# Patient Record
Sex: Male | Born: 1965 | Race: White | Hispanic: No | Marital: Married | Smoking: Never smoker
Health system: Southern US, Community
[De-identification: ages and names within clinical notes are randomized; demographics above are authoritative.]

## PROBLEM LIST (undated history)

## (undated) DIAGNOSIS — N289 Disorder of kidney and ureter, unspecified: Secondary | ICD-10-CM

## (undated) DIAGNOSIS — Z91018 Allergy to other foods: Secondary | ICD-10-CM

---

## 2006-02-28 ENCOUNTER — Emergency Department: Payer: Self-pay | Admitting: Emergency Medicine

## 2006-03-02 ENCOUNTER — Ambulatory Visit: Payer: Self-pay | Admitting: Urology

## 2006-03-05 ENCOUNTER — Ambulatory Visit: Payer: Self-pay | Admitting: Urology

## 2006-03-11 ENCOUNTER — Ambulatory Visit: Payer: Self-pay | Admitting: Urology

## 2006-03-26 ENCOUNTER — Ambulatory Visit: Payer: Self-pay | Admitting: Urology

## 2006-04-27 ENCOUNTER — Ambulatory Visit: Payer: Self-pay | Admitting: Urology

## 2007-01-25 ENCOUNTER — Ambulatory Visit: Payer: Self-pay | Admitting: Urology

## 2007-02-10 IMAGING — CR DG ABDOMEN 1V
1 series · 2 of 2 positions shown · non-contrast
Comparison: none

REASON FOR EXAM: NEPHROLITHIASIS
COMMENTS:

[Series 1: view not recorded · 0.17mm/px · 2 of 2 slices shown]
[im 1/2]
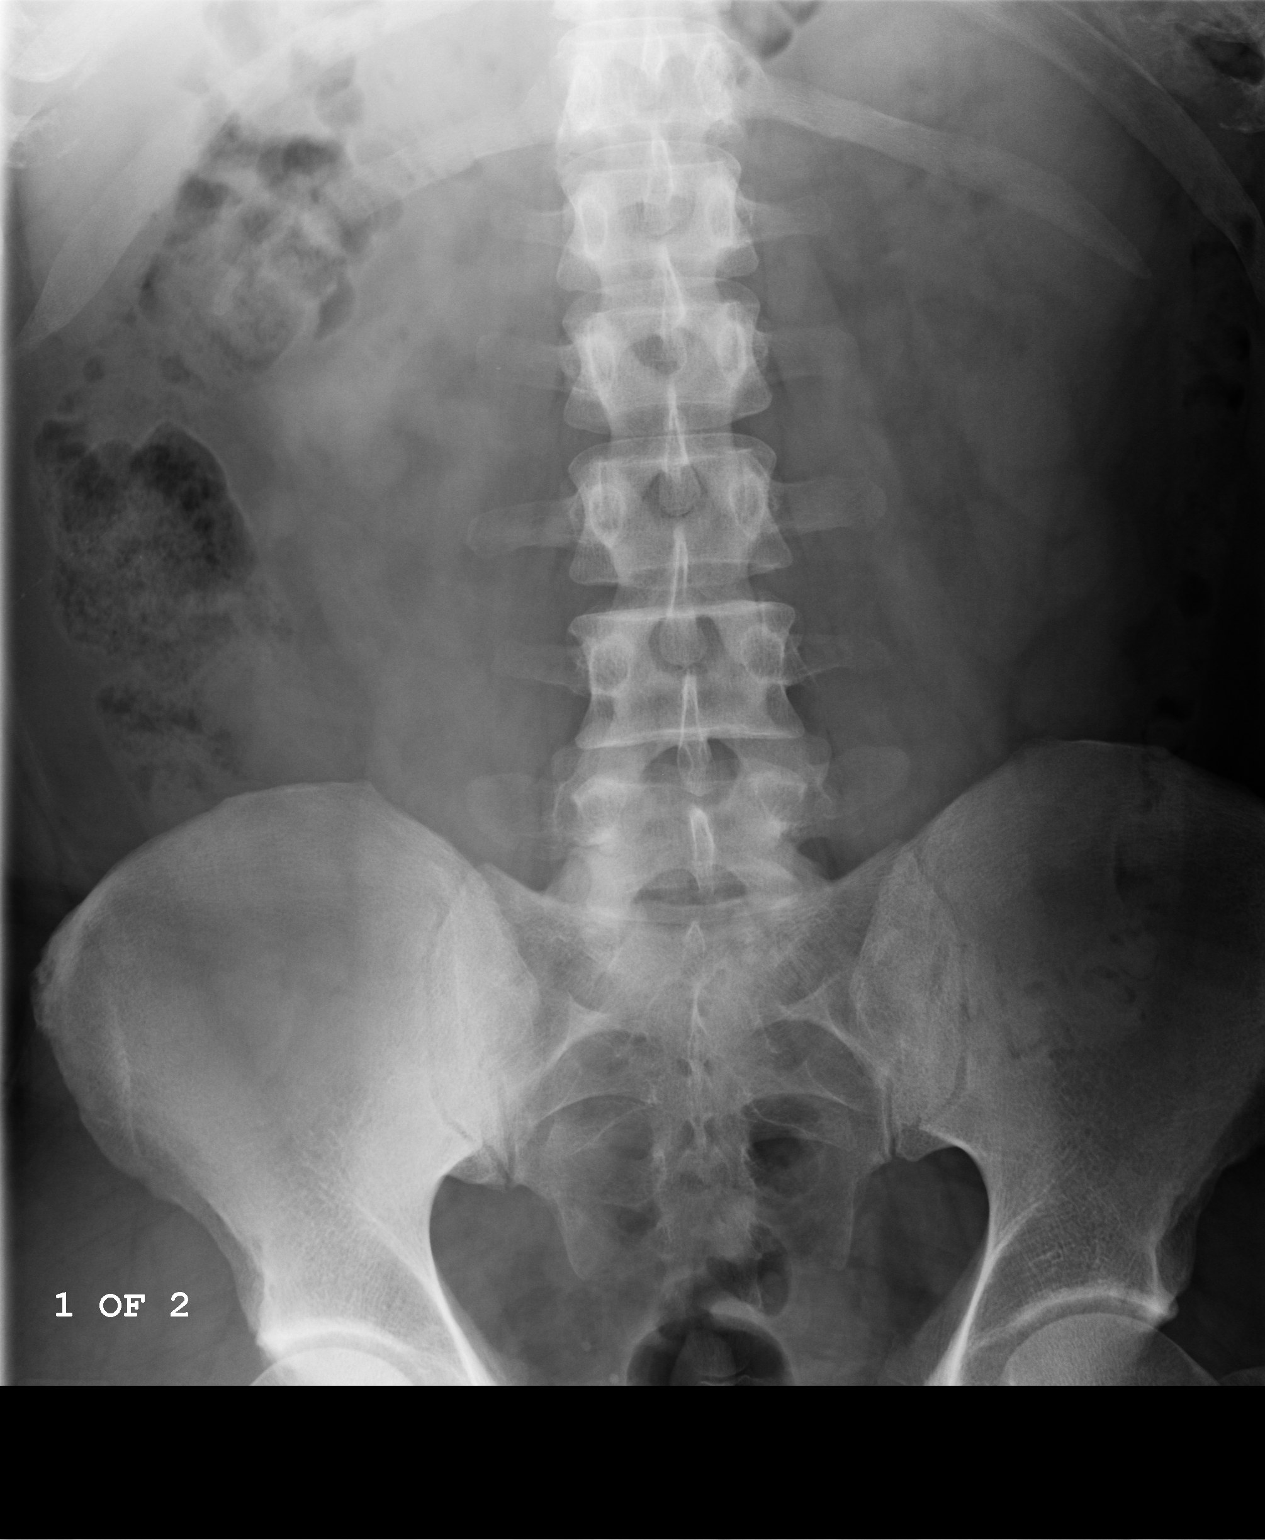
[im 2/2]
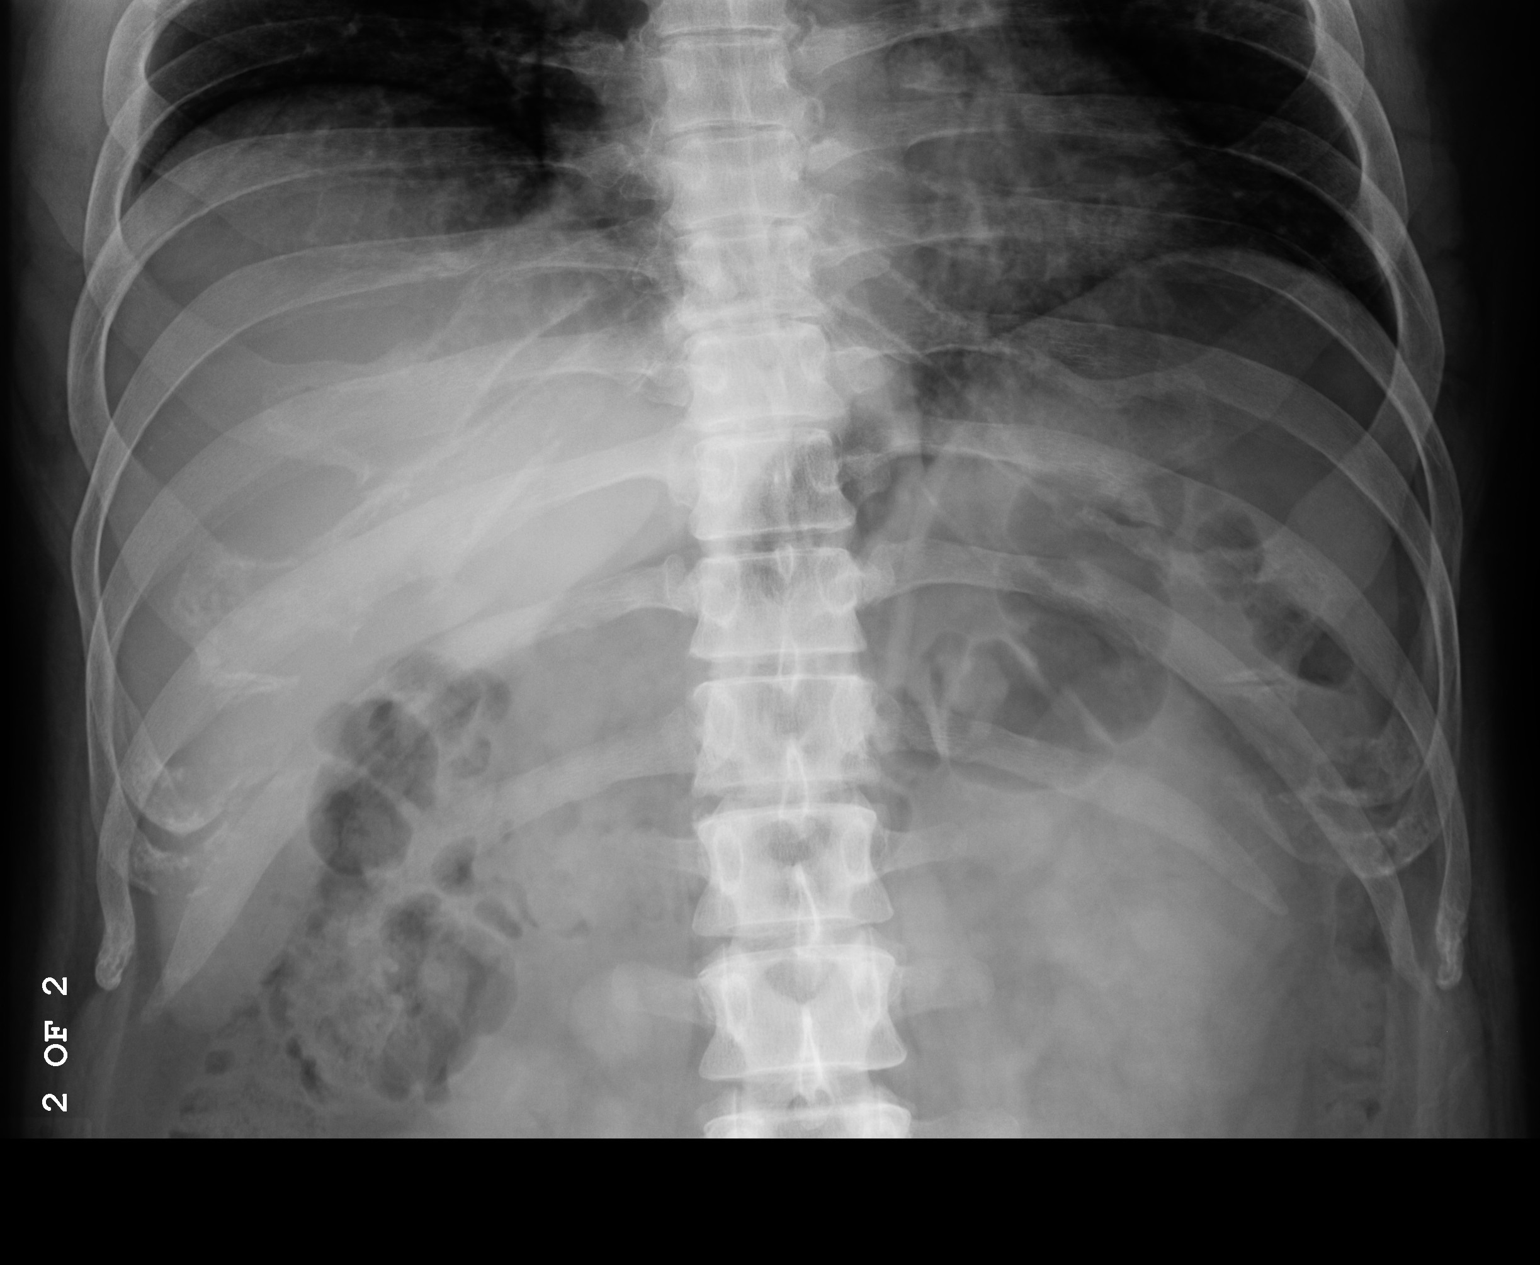

[2 of 2 positions shown; findings below may reference images not displayed]

PROCEDURE:     DXR - DXR KIDNEY URETER BLADDER  - March 26, 2006  [DATE]

RESULT:     AP view of the abdomen is compared to the prior exam of
03/11/2006.

The previously noted calcific density superior to the L4 transverse process
on the RIGHT is no longer seen. The tiny stone at the lower pole of the LEFT
kidney, reported on previous CT, is not definitely seen on plain film
examination. A few phleboliths are noted in the pelvis.
IMPRESSION: Please see above.

## 2008-10-09 ENCOUNTER — Ambulatory Visit: Payer: Self-pay | Admitting: Urology

## 2008-10-16 ENCOUNTER — Ambulatory Visit: Payer: Self-pay | Admitting: Urology

## 2008-10-17 ENCOUNTER — Ambulatory Visit: Payer: Self-pay | Admitting: Urology

## 2008-10-19 ENCOUNTER — Ambulatory Visit: Payer: Self-pay | Admitting: Urology

## 2008-11-06 ENCOUNTER — Ambulatory Visit: Payer: Self-pay | Admitting: Urology

## 2008-12-08 ENCOUNTER — Ambulatory Visit: Payer: Self-pay | Admitting: Gastroenterology

## 2011-07-24 ENCOUNTER — Ambulatory Visit: Payer: Self-pay | Admitting: Urology

## 2011-07-31 ENCOUNTER — Ambulatory Visit: Payer: Self-pay | Admitting: Urology

## 2011-08-14 ENCOUNTER — Ambulatory Visit: Payer: Self-pay | Admitting: Urology

## 2012-06-30 IMAGING — CR DG ABDOMEN 1V
1 series · 2 of 2 positions shown · non-contrast
Comparison: none

REASON FOR EXAM: nephrolithiasis post litho
COMMENTS:

PROCEDURE:     DXR - DXR KIDNEY URETER BLADDER  - August 14, 2011  [DATE]
RESULT:     Comparison: [DATE]

[Series 3: t abdomen supine · 0.14mm/px · 2 of 2 slices shown]
[im 1/2]
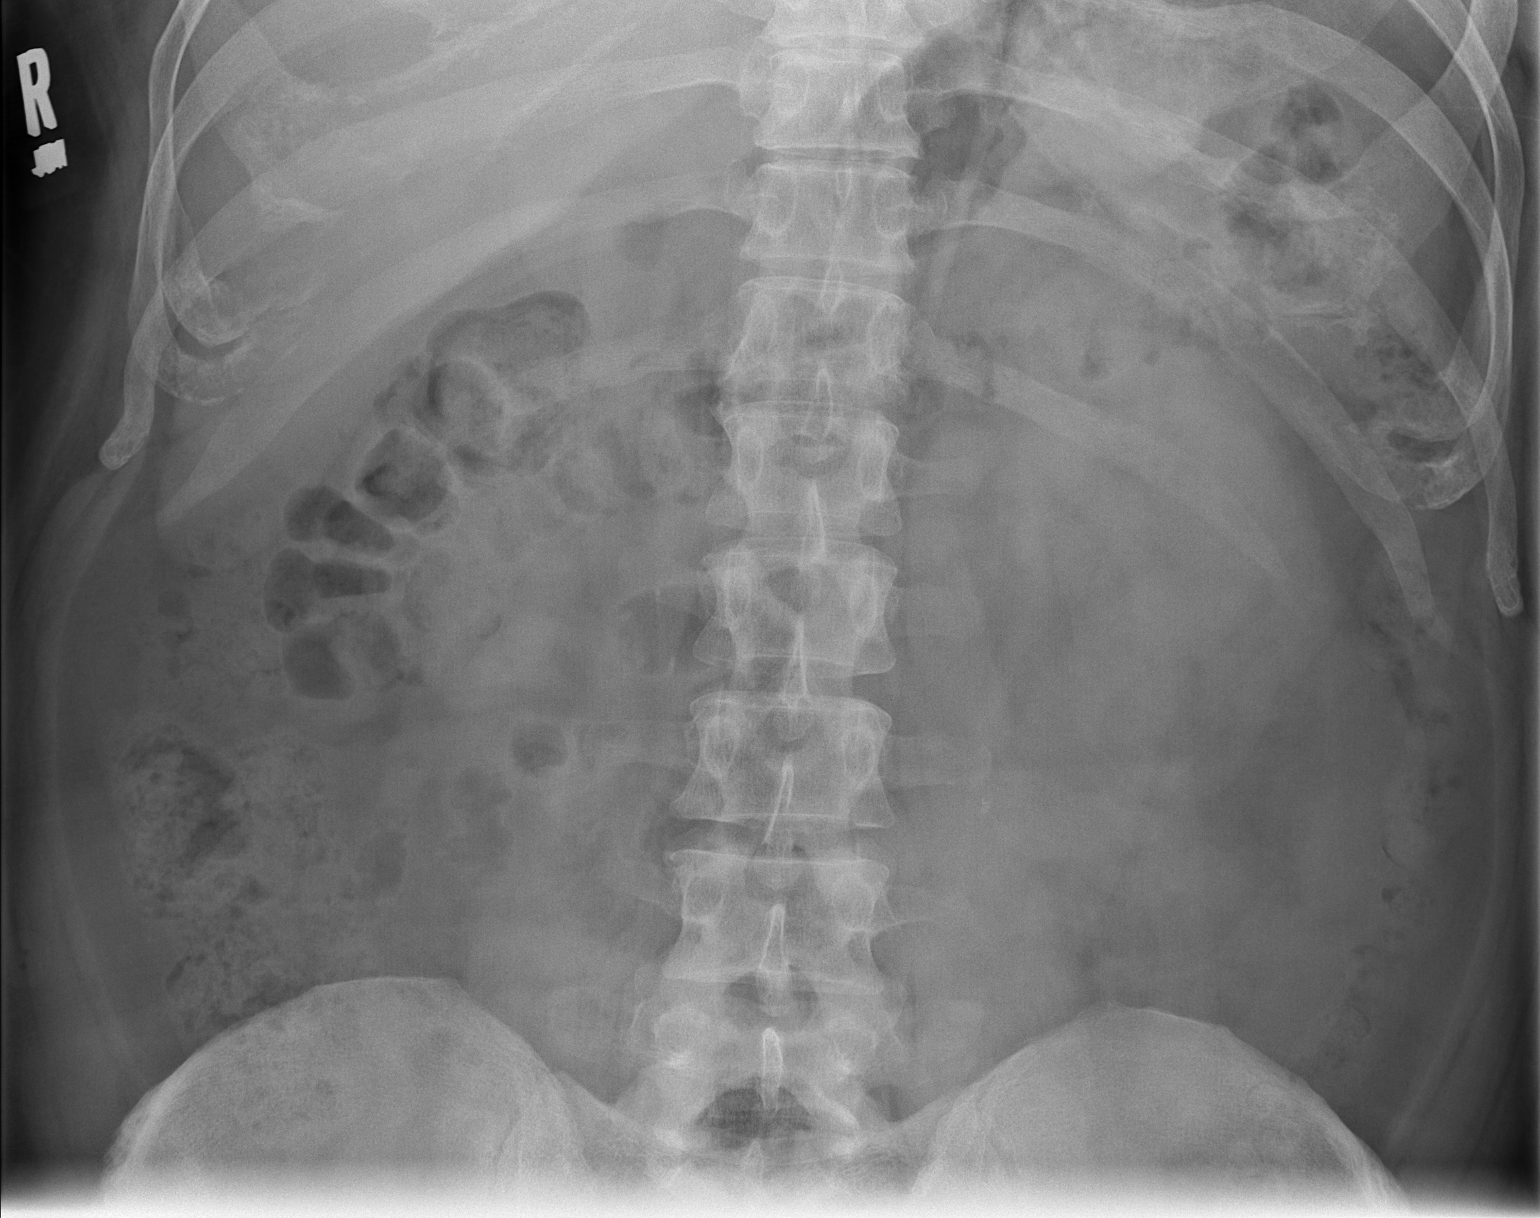
[im 2/2]
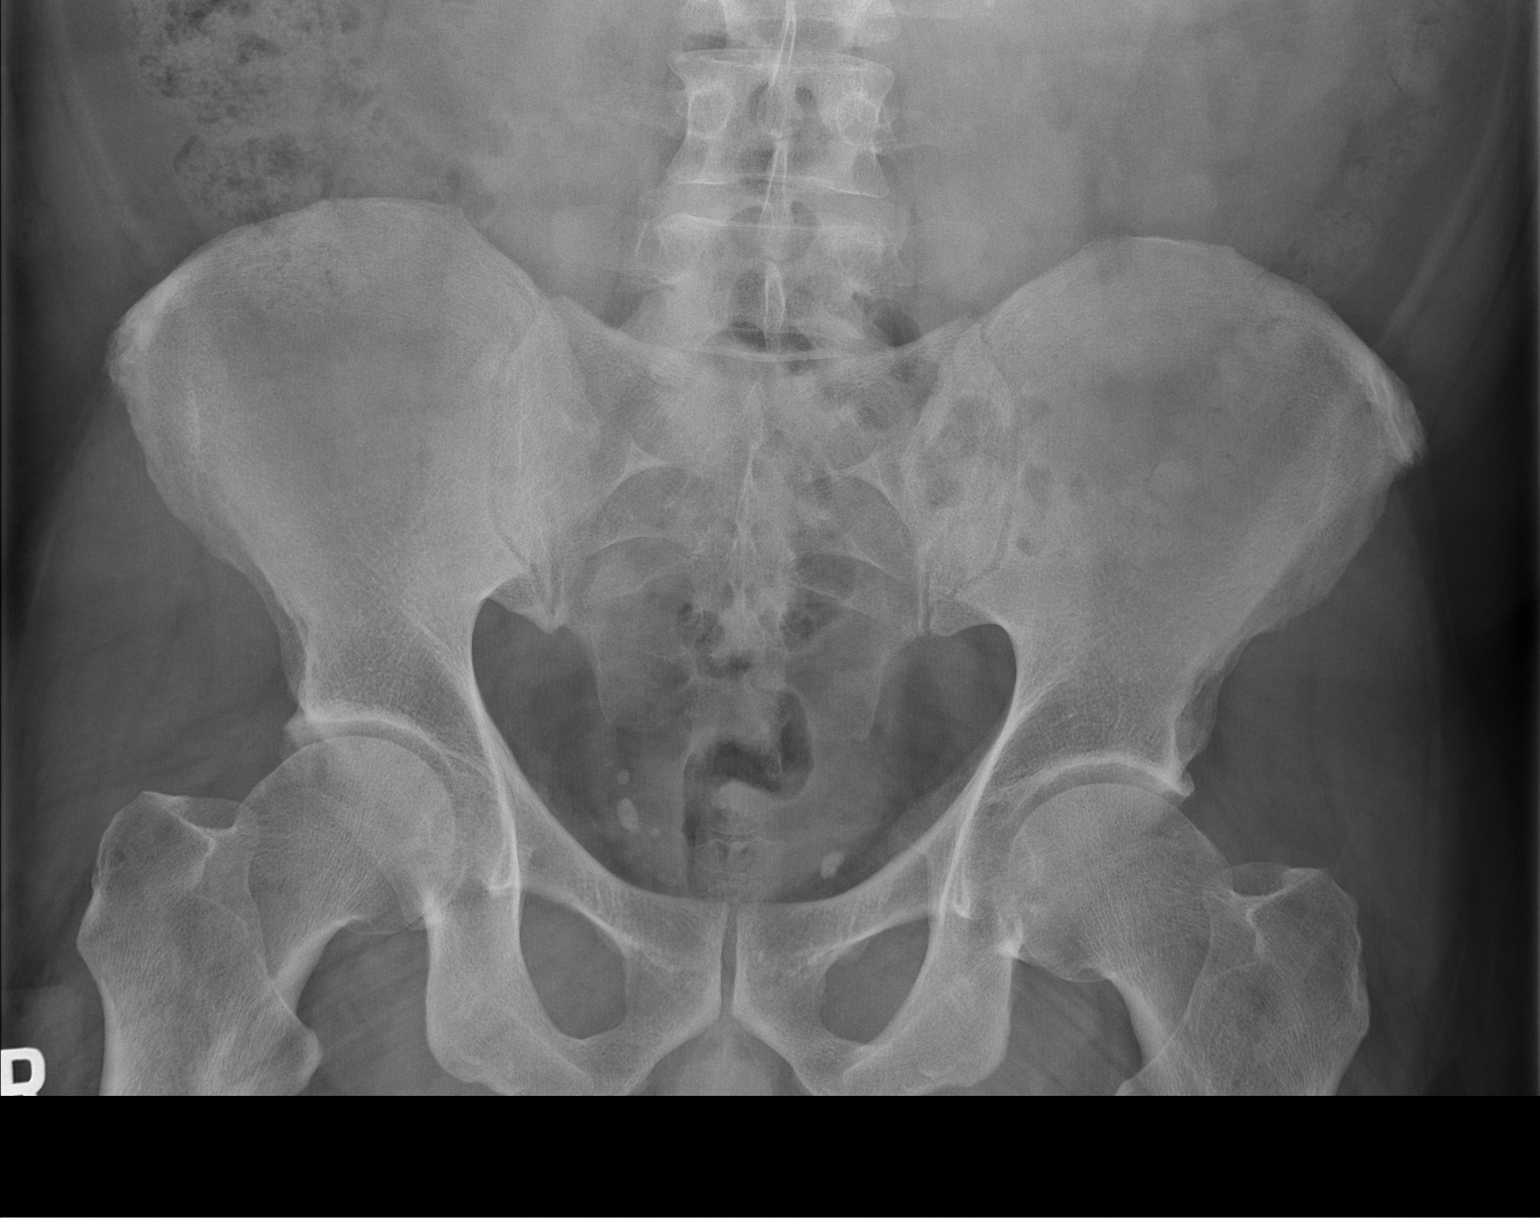

[2 of 2 positions shown; findings below may reference images not displayed]

FINDINGS: The calculus in the region of the mid left ureter is no longer appreciated.
There is a tiny, 3 mm radiodensity in this region, adjacent to the left
transverse process of L3, which may represent a residual calculus.
Calcifications in the pelvis likely represent phleboliths.
IMPRESSION: Findings which may represent a tiny residual calculus in the left mid ureter.

## 2015-12-06 DIAGNOSIS — J111 Influenza due to unidentified influenza virus with other respiratory manifestations: Secondary | ICD-10-CM | POA: Diagnosis not present

## 2018-03-05 ENCOUNTER — Emergency Department
Admission: EM | Admit: 2018-03-05 | Discharge: 2018-03-05 | Disposition: A | Discharge status: Home / Self Care | Payer: 59 | Attending: Emergency Medicine | Admitting: Emergency Medicine

## 2018-03-05 ENCOUNTER — Encounter: Payer: Self-pay | Admitting: Emergency Medicine

## 2018-03-05 DIAGNOSIS — Y92009 Unspecified place in unspecified non-institutional (private) residence as the place of occurrence of the external cause: Secondary | ICD-10-CM | POA: Diagnosis not present

## 2018-03-05 DIAGNOSIS — Y9389 Activity, other specified: Secondary | ICD-10-CM | POA: Diagnosis not present

## 2018-03-05 DIAGNOSIS — S31815A Open bite of right buttock, initial encounter: Secondary | ICD-10-CM | POA: Insufficient documentation

## 2018-03-05 DIAGNOSIS — Y990 Civilian activity done for income or pay: Secondary | ICD-10-CM | POA: Insufficient documentation

## 2018-03-05 DIAGNOSIS — S79921A Unspecified injury of right thigh, initial encounter: Secondary | ICD-10-CM | POA: Diagnosis present

## 2018-03-05 DIAGNOSIS — S71151A Open bite, right thigh, initial encounter: Secondary | ICD-10-CM | POA: Diagnosis not present

## 2018-03-05 DIAGNOSIS — S31813A Puncture wound without foreign body of right buttock, initial encounter: Secondary | ICD-10-CM | POA: Diagnosis not present

## 2018-03-05 DIAGNOSIS — S71131A Puncture wound without foreign body, right thigh, initial encounter: Secondary | ICD-10-CM | POA: Diagnosis not present

## 2018-03-05 DIAGNOSIS — W540XXA Bitten by dog, initial encounter: Secondary | ICD-10-CM | POA: Insufficient documentation

## 2018-03-05 HISTORY — DX: Disorder of kidney and ureter, unspecified: N28.9

## 2018-03-05 HISTORY — DX: Allergy to other foods: Z91.018

## 2018-03-05 MED ORDER — TETANUS-DIPHTH-ACELL PERTUSSIS 5-2.5-18.5 LF-MCG/0.5 IM SUSP
0.5000 mL | Freq: Once | INTRAMUSCULAR | Status: DC
  Filled 2018-03-05: qty 0.5

## 2018-03-05 MED ORDER — AMOXICILLIN-POT CLAVULANATE 875-125 MG PO TABS: 1.0000 | ORAL_TABLET | Freq: Two times a day (BID) | ORAL | 0 refills | Status: DC

## 2018-03-05 NOTE — ED Triage Notes (Signed)
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  Patient states he does not know if dog had rabies shots and has not yet contacted animal control.  Patient states he is self-employed with the [employer].  Patient states this will not be workman's compensation claim.  Patient reports bites to his right thigh and left buttock.  Patient states, "there are punctures."

## 2018-03-05 NOTE — Discharge Instructions (Addendum)
Follow-up with the emergency department if the dog begins to show any signs of rabies.  Have animal control checked the rabies status of the animal.  Wash the dog bite areas with soap and water.  Take the Augmentin as prescribed.

## 2018-03-05 NOTE — ED Notes (Signed)
Pt has a protein allergy. Pharmacy reports that the Tdap  Vaccine is grown in beef cultures. Pt refusing vaccine due to allergic reaction.

## 2018-03-05 NOTE — ED Notes (Signed)
Animal control in room with pt.

## 2018-03-05 NOTE — ED Provider Notes (Signed)
[facility] Emergency Department Provider Note  ____________________________________________   First MD Initiated Contact with Patient 03/05/18 1031     (approximate)  I have reviewed the triage vital signs and the nursing notes.   HISTORY  Chief Complaint Animal Bite    HPI Kevin Alvarez is a 52 y.o. male presents emergency department complaining of being bitten by a dog.  He states the dog was a very large dog.  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  He states he is here mainly to have this documented.  He states there are puncture wounds and teeth marks on his leg.  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  He is concerned if the tetanus has any of this in it.  Past Medical History:  Diagnosis Date  . Allergy to alpha-gal   . Renal disorder    Kidney stones    There are no active problems to display for this patient.   History reviewed. No pertinent surgical history.  Prior to Admission medications   Medication Sig Start Date End Date Taking? Authorizing Provider  amoxicillin-clavulanate (AUGMENTIN) 875-125 MG tablet Take 1 tablet by mouth 2 (two) times daily. 03/05/18   Faythe Ghee, PA-C    Allergies Pork-derived products  No family history on file.  Social History Social History   Tobacco Use  . Smoking status: Never Smoker  . Smokeless tobacco: Never Used  Substance Use Topics  . Alcohol use: Yes    Comment: occasionally  . Drug use: Not on file    Review of Systems  Constitutional: No fever/chills Eyes: No visual changes. ENT: No sore throat. Respiratory: Denies cough Genitourinary: Negative for dysuria. Musculoskeletal: Negative for back pain. Skin: Negative for rash.  Stiff for dog bite to the right thigh    ____________________________________________   PHYSICAL EXAM:  VITAL SIGNS: ED Triage Vitals [03/05/18  1015]  Enc Vitals Group     BP (!) 146/105     Pulse Rate 81     Resp 18     Temp 99.3 F (37.4 C)     Temp Source Oral     SpO2 94 %     Weight 245 lb (111.1 kg)     Height 5\' 10"  (1.778 m)     Head Circumference      Peak Flow      Pain Score 1     Pain Loc      Pain Edu?      Excl. in GC?     Constitutional: Alert and oriented. Well appearing and in no acute distress. Eyes: Conjunctivae are normal.  Head: Atraumatic. Nose: No congestion/rhinnorhea. Mouth/Throat: Mucous membranes are moist.   Cardiovascular: Normal rate, regular rhythm. Respiratory: Normal respiratory effort.  No retractions GU: deferred Musculoskeletal: FROM all extremities, warm and well perfused Neurologic:  Normal speech and language.  Skin:  Skin is warm, dry .Marland Kitchen Positive for teeth marks and 2 puncture wounds on the right thigh, 2 puncture wounds on the right buttock, no foreign body is noted.  The areas are not bleeding or weeping at this time.   Psychiatric: Mood and affect are normal. Speech and behavior are normal.  ____________________________________________   LABS (all labs ordered are listed, but only abnormal results are displayed)  Labs Reviewed - No data to display ____________________________________________  ____________________________________________  RADIOLOGY    ____________________________________________   PROCEDURES  Procedure(s) performed: No  Procedures    ____________________________________________   INITIAL IMPRESSION / ASSESSMENT AND PLAN / ED COURSE  Pertinent labs & imaging results that were available during my care of the patient were reviewed by me and considered in my medical decision making (see chart for details).  Patient is 52 year old male presents emergency department complaining of a dog bite.  The dog was inside the house he was inspecting for an appraisal.  The owner stated that the immunizations are up-to-date but he is unsure.  Animal  control was notified while he was here in the emergency department.  On physical exam the patient has teeth marks and 2 puncture wounds on the right thigh, 2 puncture wounds on the right buttock.  Not weeping or actively bleeding at this time.  Due to the patient being allergic to be, we did not give tetanus as this is in a beef derived base.  Patient's wounds were cleaned with saline.  He is to follow-up with his regular doctor if there is any sign of infection.  He was given a prescription for Augmentin 875 twice daily.  He is to follow-up with his regular doctor as needed.  If the dog shows any signs of rabies, the patient is to return to the emergency department for rabies vaccinations.  Animal control was notified so that this can be documented.  The patient states he understands all of her instructions.  He was discharged in stable condition.     As part of my medical decision making, I reviewed the following data within the electronic MEDICAL RECORD NUMBER Nursing notes reviewed and incorporated, Old chart reviewed, Notes from prior ED visits and [place] Controlled Substance Database  ____________________________________________   FINAL CLINICAL IMPRESSION(S) / ED DIAGNOSES  Final diagnoses:  Dog bite, initial encounter      NEW MEDICATIONS STARTED DURING THIS VISIT:  Discharge Medication List as of 03/05/2018 11:12 AM    START taking these medications   Details  amoxicillin-clavulanate (AUGMENTIN) 875-125 MG tablet Take 1 tablet by mouth 2 (two) times daily., Starting Fri 03/05/2018, Print         Note:  This document was prepared using Dragon voice recognition software and may include unintentional dictation errors.    Faythe GheeFisher, Carrera Kiesel W, PA-C 03/05/18 1805    Don PerkingVeronese, [place], MD 03/14/18 [phone]

## 2018-03-05 NOTE — ED Notes (Signed)
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

## 2018-10-25 DIAGNOSIS — J069 Acute upper respiratory infection, unspecified: Secondary | ICD-10-CM | POA: Diagnosis not present

## 2020-04-18 ENCOUNTER — Ambulatory Visit (INDEPENDENT_AMBULATORY_CARE_PROVIDER_SITE_OTHER): Payer: 59 | Admitting: Urology

## 2020-04-18 ENCOUNTER — Other Ambulatory Visit: Payer: Self-pay

## 2020-04-18 ENCOUNTER — Ambulatory Visit
Admission: RE | Admit: 2020-04-18 | Discharge: 2020-04-18 | Disposition: A | Discharge status: Home / Self Care | Payer: 59 | Attending: Urology | Admitting: Urology

## 2020-04-18 ENCOUNTER — Encounter: Payer: Self-pay | Admitting: Urology

## 2020-04-18 ENCOUNTER — Ambulatory Visit
Admission: RE | Admit: 2020-04-18 | Discharge: 2020-04-18 | Disposition: A | Discharge status: Home / Self Care | Payer: 59 | Source: Ambulatory Visit | Attending: Urology | Admitting: Urology

## 2020-04-18 VITALS — BP 166/94 | HR 73 | Ht 70.0 in | Wt 239.0 lb

## 2020-04-18 DIAGNOSIS — M160 Bilateral primary osteoarthritis of hip: Secondary | ICD-10-CM | POA: Diagnosis not present

## 2020-04-18 DIAGNOSIS — N200 Calculus of kidney: Secondary | ICD-10-CM | POA: Diagnosis not present

## 2020-04-18 DIAGNOSIS — R1030 Lower abdominal pain, unspecified: Secondary | ICD-10-CM

## 2020-04-18 DIAGNOSIS — R109 Unspecified abdominal pain: Secondary | ICD-10-CM

## 2020-04-18 DIAGNOSIS — I878 Other specified disorders of veins: Secondary | ICD-10-CM | POA: Diagnosis not present

## 2020-04-18 NOTE — Progress Notes (Signed)
04/18/2020 2:22 PM   Kevin Alvarez 16-Jul-1966 [id number]  Referring provider: No referring provider defined for this encounter.  Chief Complaint  Patient presents with   Nephrolithiasis    HPI:  Kevin Alvarez made an appt today for umbilical, lower abd and low back pain. He had a "low grade 99.5 temp" and couldn't lay on his right side. He has a remote h/o stones and needed ESWL and this seemed similar. He is voiding normally. No dysuria or gross hematuria. He does not have a PCP. His mom passed away last week and he's been running around and busy. No constipation or diarrhea. He is staying hydrated. No N/V.   He had a KUB today. Unofficially, I don't see any stone. Bowel gas pattern normal. UA today is normal. No rbc.    PMH: Past Medical History:  Diagnosis Date   Allergy to alpha-gal    Renal disorder    Kidney stones    Surgical History: No past surgical history on file.  Home Medications:  Allergies as of 04/18/2020      Reactions   Pork-derived Products Rash   States he has protein allergy from tick bite      Medication List       Accurate as of April 18, 2020  2:22 PM. If you have any questions, ask your nurse or doctor.        STOP taking these medications   amoxicillin-clavulanate 875-125 MG tablet Commonly known as: AUGMENTIN Stopped by: Jerilee Field, MD       Allergies:  Allergies  Allergen Reactions   Pork-Derived Products Rash    States he has protein allergy from tick bite    Family History: No family history on file.  Social History:  reports that he has never smoked. He has never used smokeless tobacco. He reports current alcohol use. No history on file for drug use.   Physical Exam: BP (!) 166/94 (BP Location: Left Arm, Patient Position: Sitting, Cuff Size: Normal)    Pulse 73    Ht 5\' 10"  (1.778 m)    Wt 239 lb (108.4 kg)    BMI 34.29 kg/m   Constitutional:  Alert and oriented, No acute distress. HEENT: [place] AT, moist mucus  membranes.  Trachea midline, no masses. Cardiovascular: No clubbing, cyanosis, or edema. Respiratory: Normal respiratory effort, no increased work of breathing. GI: Abdomen is soft, nontender, nondistended, no abdominal masses GU: No CVA tenderness Lymph: No cervical or inguinal lymphadenopathy. Skin: No rashes, bruises or suspicious lesions. Neurologic: Grossly intact, no focal deficits, moving all 4 extremities. Psychiatric: Normal mood and affect. GU: Penis circumcised, normal foreskin, testicles descended bilaterally and palpably normal, bilateral epididymis palpably normal, scrotum normal DRE: Prostate 25 g, smooth without hard area or nodule  Laboratory Data: No results found for: WBC, HGB, HCT, MCV, PLT  No results found for: CREATININE  No results found for: PSA  No results found for: TESTOSTERONE  No results found for: HGBA1C  Urinalysis No results found for: COLORURINE, APPEARANCEUR, LABSPEC, PHURINE, GLUCOSEU, HGBUR, BILIRUBINUR, KETONESUR, PROTEINUR, UROBILINOGEN, NITRITE, LEUKOCYTESUR  No results found for: LABMICR, WBCUA, RBCUA, LABEPIT, MUCUS, BACTERIA  Pertinent Imaging: KUB  No results found for this or any previous visit.  No results found for this or any previous visit.  No results found for this or any previous visit.  No results found for this or any previous visit.  No results found for this or any previous visit.  No results found for this  or any previous visit.  No results found for this or any previous visit.  No results found for this or any previous visit.   Assessment & Plan:    1. abd pain - KUB, exam and UA negative. Discussed if he continues to have pain or it worsens, fever, etc to go to ED for CT. Otherwise let us know if any changes and I encouraged him to find a PCP.  - Urinalysis, Complete   No follow-ups on file.  Jerilee Field, MD  [facility] [address] [place], [place]  [address] [phone]

## 2020-04-18 NOTE — Patient Instructions (Signed)
Abdominal Pain, Adult Pain in the abdomen (abdominal pain) can be caused by many things. Often, abdominal pain is not serious and it gets better with no treatment or by being treated at home. However, sometimes abdominal pain is serious. Your health care provider will ask questions about your medical history and do a physical exam to try to determine the cause of your abdominal pain. Follow these instructions at home:  Medicines  Take over-the-counter and prescription medicines only as told by your health care provider.  Do not take a laxative unless told by your health care provider. General instructions  Watch your condition for any changes.  Drink enough fluid to keep your urine pale yellow.  Keep all follow-up visits as told by your health care provider. This is important. Contact a health care provider if:  Your abdominal pain changes or gets worse.  You are not hungry or you lose weight without trying.  You are constipated or have diarrhea for more than 2-3 days.  You have pain when you urinate or have a bowel movement.  Your abdominal pain wakes you up at night.  Your pain gets worse with meals, after eating, or with certain foods.  You are vomiting and cannot keep anything down.  You have a fever.  You have blood in your urine. Get help right away if:  Your pain does not go away as soon as your health care provider told you to expect.  You cannot stop vomiting.  Your pain is only in areas of the abdomen, such as the right side or the left lower portion of the abdomen. Pain on the right side could be caused by appendicitis.  You have bloody or black stools, or stools that look like tar.  You have severe pain, cramping, or bloating in your abdomen.  You have signs of dehydration, such as: ? Dark urine, very little urine, or no urine. ? Cracked lips. ? Dry mouth. ? Sunken eyes. ? Sleepiness. ? Weakness.  You have trouble breathing or chest  pain. Summary  Often, abdominal pain is not serious and it gets better with no treatment or by being treated at home. However, sometimes abdominal pain is serious.  Watch your condition for any changes.  Take over-the-counter and prescription medicines only as told by your health care provider.  Contact a health care provider if your abdominal pain changes or gets worse.  Get help right away if you have severe pain, cramping, or bloating in your abdomen. This information is not intended to replace advice given to you by your health care provider. Make sure you discuss any questions you have with your health care provider. Document Revised: 01/10/2019 Document Reviewed: 01/10/2019 Elsevier Patient Education  2020 [employer]vier Inc.  

## 2020-04-20 ENCOUNTER — Telehealth: Payer: Self-pay

## 2020-04-20 LAB — URINALYSIS, COMPLETE
Bilirubin, UA: NEGATIVE
Leukocytes,UA: NEGATIVE
Nitrite, UA: NEGATIVE
RBC, UA: NEGATIVE
Specific Gravity, UA: >1.03 — ABNORMAL HIGH (ref 1.005–1.030)
Urobilinogen, Ur: 2 mg/dL — ABNORMAL HIGH (ref 0.2–1.0)
pH, UA: 5.5 (ref 5.0–7.5)

## 2020-04-20 LAB — MICROSCOPIC EXAMINATION: Bacteria, UA: NONE SEEN

## 2020-04-20 NOTE — Telephone Encounter (Signed)
-----   Message from Jerilee Field, MD sent at 04/19/2020  5:10 PM EDT ----- Kevin Alvarez - let Kevin Alvarez know radiologist looked at his KUB and agree no obvious kidney stones. I hope he is feeling better.  ----- Message ----- From: Lissa Hoard, CMA Sent: 04/19/2020   9:14 AM EDT To: Jerilee Field, MD   ----- Message ----- From: Leory Plowman, Rad Results In Sent: 04/18/2020  10:01 PM EDT To: Lissa Hoard, CMA

## 2020-04-20 NOTE — Telephone Encounter (Signed)
Notified patient as advised. Patient verbalized understanding.  

## 2021-03-05 IMAGING — CR DG ABDOMEN 1V
1 series · 2 of 2 positions shown · non-contrast
Comparison: August 14, 2011

CLINICAL DATA: Flank pain with a history of stones.

EXAM:
ABDOMEN - 1 VIEW

[Series 1: dg abd 1 view · 0.14mm/px · 2 of 2 slices shown]
[im 1/2]
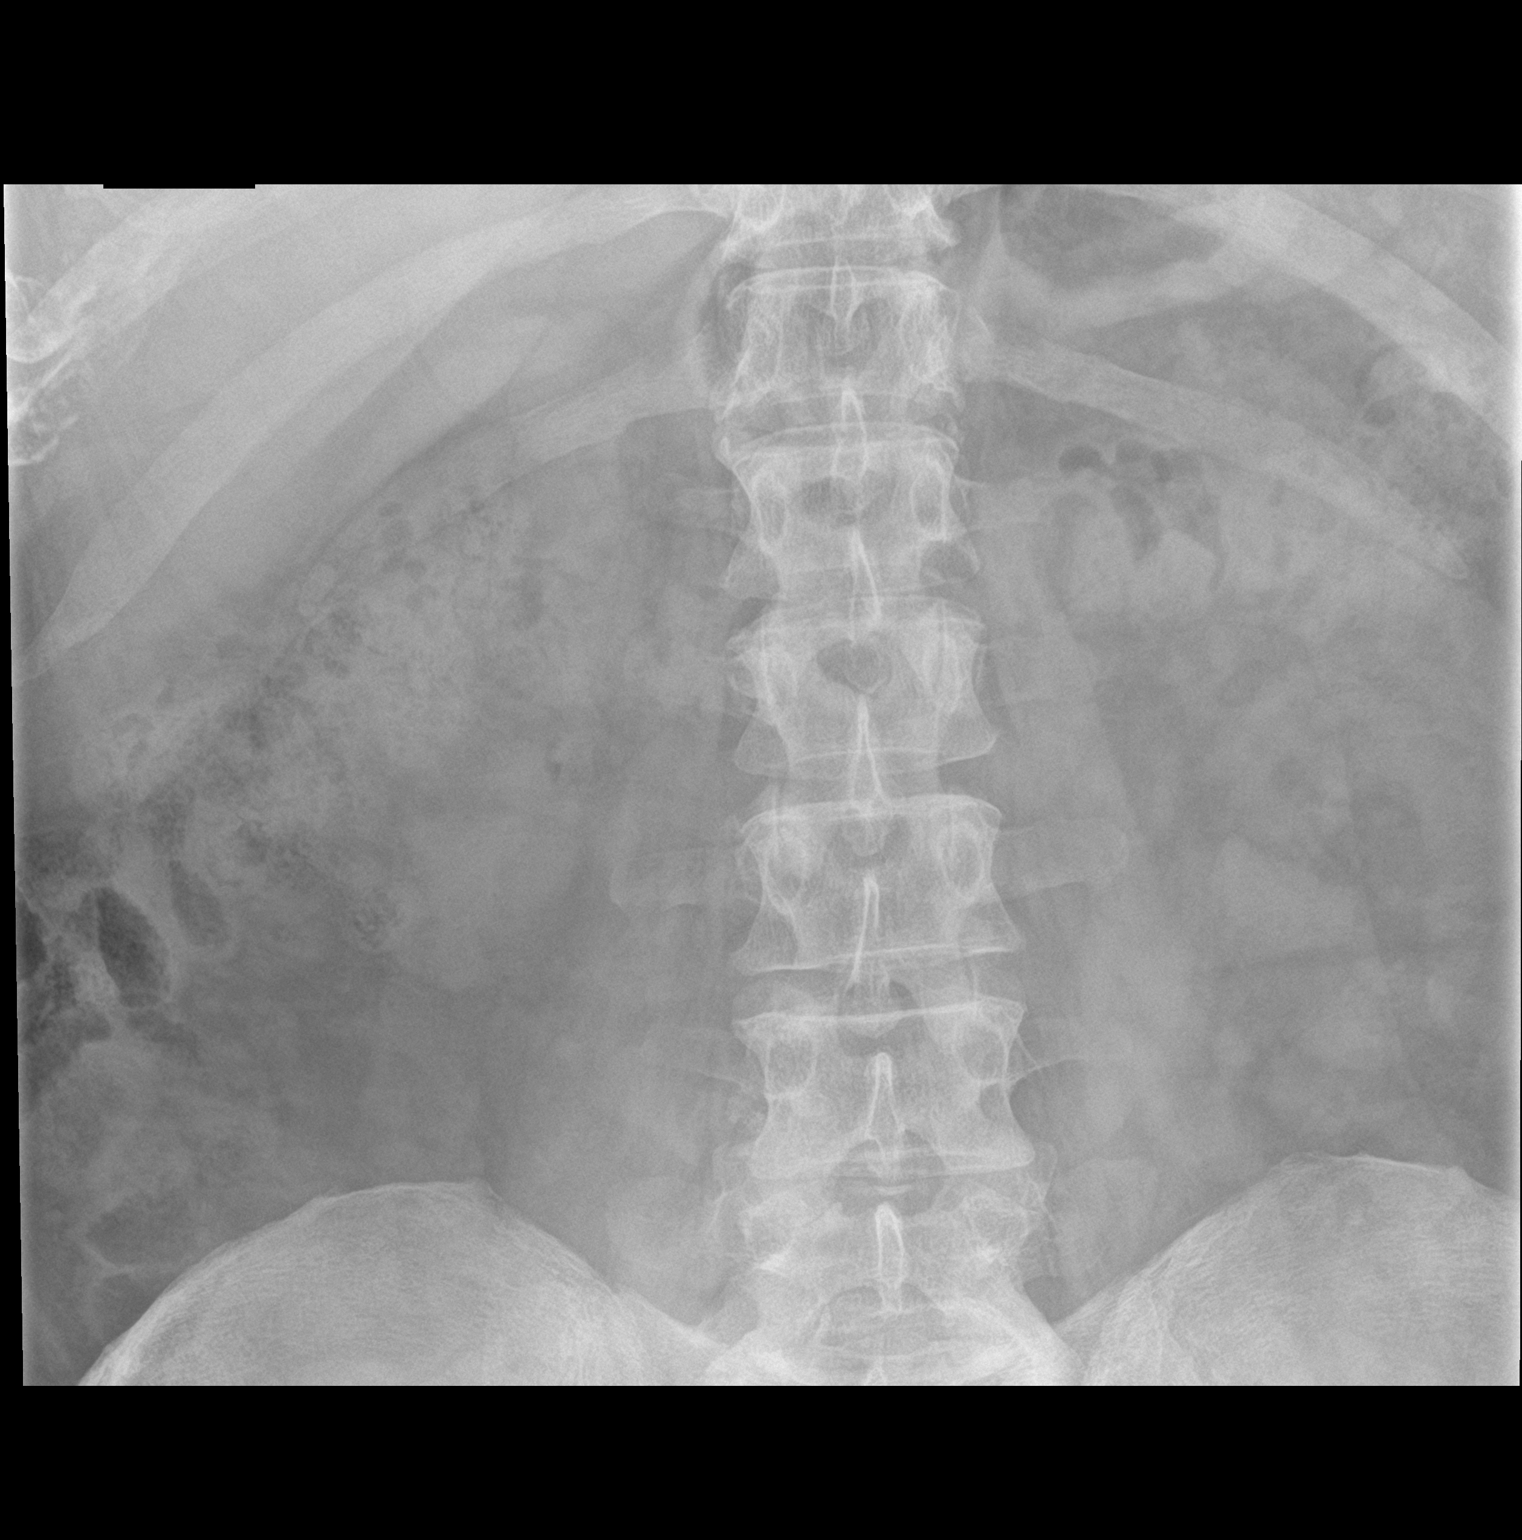
[im 2/2]
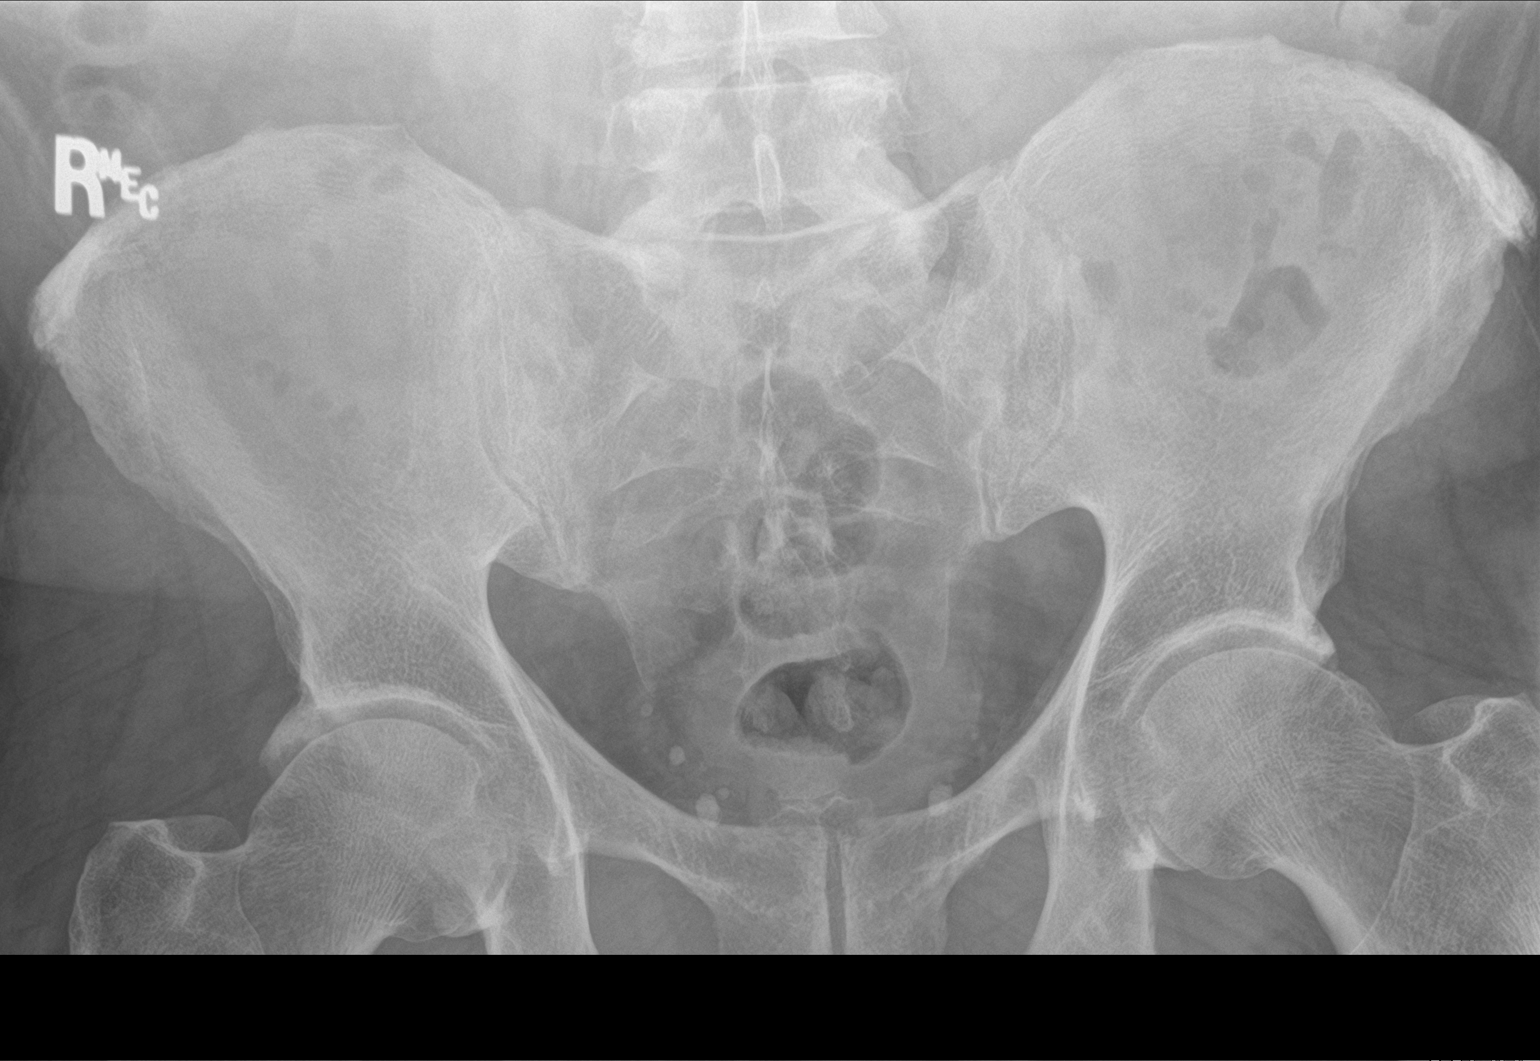

[2 of 2 positions shown; findings below may reference images not displayed]

FINDINGS: The bowel gas pattern is normal. No radio-opaque calculi or other
significant radiographic abnormality are seen. Multiple
calcifications project over the patient's pelvis. These appear to be
relatively stable since 0390 and are favored to represent
phleboliths. There are degenerative changes of both hips as well as
the partially visualized thoracolumbar spine.
IMPRESSION: No definite radiopaque calculi identified. Multiple calcifications
project over the patient's pelvis are favored to represent
phleboliths.

## 2021-03-11 ENCOUNTER — Other Ambulatory Visit: Payer: Self-pay

## 2021-03-11 MED ORDER — DOXYCYCLINE HYCLATE 100 MG PO CAPS
ORAL_CAPSULE | ORAL | 0 refills | Status: AC
  Filled 2021-03-11: qty 28, 14d supply, fill #0

## 2022-05-05 ENCOUNTER — Other Ambulatory Visit: Payer: Self-pay

## 2022-05-05 MED ORDER — AMOXICILLIN 500 MG PO CAPS
500.0000 mg | ORAL_CAPSULE | ORAL | 0 refills | Status: AC
  Filled 2022-05-05: qty 22, 7d supply, fill #0

## 2022-05-30 ENCOUNTER — Other Ambulatory Visit: Payer: Self-pay

## 2022-05-30 MED ORDER — OXYCODONE-ACETAMINOPHEN 5-325 MG PO TABS
1.0000 | ORAL_TABLET | Freq: Four times a day (QID) | ORAL | 0 refills | Status: AC | PRN
  Filled 2022-05-30: qty 5, 2d supply, fill #0

## 2022-05-30 MED ORDER — CHLORHEXIDINE GLUCONATE 0.12 % MT SOLN
OROMUCOSAL | 0 refills | Status: AC
  Filled 2022-05-30: qty 473, 15d supply, fill #0

## 2024-10-19 ENCOUNTER — Other Ambulatory Visit: Payer: Self-pay

## 2024-10-19 MED ORDER — AMOXICILLIN 500 MG PO CAPS
ORAL_CAPSULE | ORAL | 0 refills | Status: AC
  Filled 2024-10-19: qty 22, 7d supply, fill #0
